# Patient Record
Sex: Male | Born: 1970 | Race: Black or African American | Hispanic: No | Marital: Single | Smoking: Never smoker
Health system: Southern US, Community
[De-identification: ages and names within clinical notes are randomized; demographics above are authoritative.]

## PROBLEM LIST (undated history)

## (undated) DIAGNOSIS — J45909 Unspecified asthma, uncomplicated: Secondary | ICD-10-CM

## (undated) DIAGNOSIS — J302 Other seasonal allergic rhinitis: Secondary | ICD-10-CM

---
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
## 2005-06-18 ENCOUNTER — Encounter: Admission: RE | Admit: 2005-06-18 | Discharge: 2005-09-16 | Payer: Self-pay | Admitting: Family Medicine

## 2008-03-12 ENCOUNTER — Ambulatory Visit (HOSPITAL_COMMUNITY): Admission: EM | Admit: 2008-03-12 | Discharge: 2008-03-12 | Payer: Self-pay | Admitting: Emergency Medicine

## 2008-03-12 HISTORY — PX: OTHER SURGICAL HISTORY: SHX169

## 2010-09-22 NOTE — Op Note (Signed)
NAME:  Christopher Reilly, Christopher Reilly NO.:  [id number]   MEDICAL RECORD NO.:  [id number]          PATIENT TYPE:  EMS   LOCATION:  ED                           FACILITY:  [facility]   PHYSICIAN:  John C. Madilyn Fireman, M.D.    DATE OF BIRTH:  [birth year]   DATE OF PROCEDURE:  03/12/2008  DATE OF DISCHARGE:                               OPERATIVE REPORT   PROCEDURE PERFORMED:  Esophagogastroduodenoscopy with removal of foreign  body.   INDICATIONS FOR PROCEDURE:  A sensation of esophageal obstruction after  swallowing a large pill tonight.  The patient has been unable to handle  his saliva after 2-3 hours observation the emergency room.   PROCEDURE:  The patient was placed in the left lateral decubitus  position and placed on the pulse monitor with continuous low-flow oxygen  delivered by nasal cannula.  He was sedated with 50 mcg IV fentanyl and  5 mg IV Versed.  The Olympus video endoscope was advanced under direct  vision into the oropharynx and esophagus.  In the distal esophagus there  was a semi solid white object consistent with a large pill.  This was  pushed gently with the scope and passed into the stomach.  It revealed a  lot of bloody mucosa at the area of impaction.  I passed the scope  toward it and there were significant resistance with the clearly define  ring with the scope traversing the ring with some resistance.  There was  small amount of liquid secretions as well as the pill that had fallen  into the stomach but no other abnormalities in the stomach.  The scope  was then withdrawn and the patient returned to the recovery room in  stable condition.  He tolerated the procedure well.  There were no  immediate complications.   IMPRESSION:  Esophageal foreign body (pill) relieved with the endoscope.   PLAN:  Will have the patient follow up for elective esophageal  dilatation as an outpatient.           ______________________________  Everardo All. Madilyn Fireman, M.D.     JCH/MEDQ   D:  03/12/2008  T:  03/12/2008  Job:  [id number]

## 2013-05-25 ENCOUNTER — Emergency Department (INDEPENDENT_AMBULATORY_CARE_PROVIDER_SITE_OTHER)
Admission: EM | Admit: 2013-05-25 | Discharge: 2013-05-25 | Disposition: A | Discharge status: Home / Self Care | Payer: Self-pay | Source: Home / Self Care | Attending: Family Medicine | Admitting: Family Medicine

## 2013-05-25 ENCOUNTER — Emergency Department (INDEPENDENT_AMBULATORY_CARE_PROVIDER_SITE_OTHER): Payer: Self-pay

## 2013-05-25 ENCOUNTER — Encounter (HOSPITAL_COMMUNITY): Payer: Self-pay | Admitting: Emergency Medicine

## 2013-05-25 DIAGNOSIS — R69 Illness, unspecified: Principal | ICD-10-CM

## 2013-05-25 DIAGNOSIS — J111 Influenza due to unidentified influenza virus with other respiratory manifestations: Secondary | ICD-10-CM

## 2013-05-25 HISTORY — DX: Other seasonal allergic rhinitis: J30.2

## 2013-05-25 HISTORY — DX: Unspecified asthma, uncomplicated: J45.909

## 2013-05-25 MED ORDER — PREDNISONE 10 MG PO TABS: 30.0000 mg | ORAL_TABLET | Freq: Every day | ORAL | Status: DC

## 2013-05-25 MED ORDER — IPRATROPIUM-ALBUTEROL 0.5-2.5 (3) MG/3ML IN SOLN
RESPIRATORY_TRACT | Status: AC
  Filled 2013-05-25: qty 3

## 2013-05-25 MED ORDER — HYDROCODONE-ACETAMINOPHEN 5-325 MG PO TABS: 0.5000 | ORAL_TABLET | Freq: Every evening | ORAL | Status: DC | PRN

## 2013-05-25 MED ORDER — IPRATROPIUM-ALBUTEROL 0.5-2.5 (3) MG/3ML IN SOLN
3.0000 mL | Freq: Once | RESPIRATORY_TRACT | Status: AC
  Administered 2013-05-25: 3 mL via RESPIRATORY_TRACT

## 2013-05-25 NOTE — ED Provider Notes (Signed)
Christopher Reilly is a 43 y.o. male who presents to Urgent Care today for 3 days of headache chills body aches cough productive of green sputum and nasal congestion and runny nose. Patient denies any shortness of breath nausea vomiting or diarrhea. He's tried some over-the-counter cough and cold medications which have not worked very well. He did not receive a flu vaccination yet this year.    patient has a pertinent past medical history for mild intermittent asthma.   Past Medical History  Diagnosis Date  . Seasonal allergies   . Asthma    History  Substance Use Topics  . Smoking status: Never Smoker   . Smokeless tobacco: Not on file  . Alcohol Use: No   ROS as above Medications: No current facility-administered medications for this encounter.   Current Outpatient Prescriptions  Medication Sig Dispense Refill  . HYDROcodone-acetaminophen (NORCO/VICODIN) 5-325 MG per tablet Take 0.5 tablets by mouth at bedtime as needed (cough).  10 tablet  0  . predniSONE (DELTASONE) 10 MG tablet Take 3 tablets (30 mg total) by mouth daily.  15 tablet  0    Exam:  BP 128/82  Pulse 88  Temp(Src) 98 F (36.7 C) (Oral)  Resp 16  SpO2 99% Gen: Well NAD HEENT: EOMI,  MMM Lungs: Normal work of breathing. Bilateral basilar crackles Heart: RRR no MRG Abd: NABS, Soft. NT, ND Exts: Brisk capillary refill, warm and well perfused.   Patient was given a DuoNeb nebulizer treatment and had no significant improvement in symptoms.  No results found for this or any previous visit (from the past 24 hour(s)). Dg Chest 2 View  05/25/2013   CLINICAL DATA:  Body aches, cough, low-grade fever.  EXAM: CHEST  2 VIEW  COMPARISON:  No priors.  FINDINGS: Lung volumes are normal. No consolidative airspace disease. No pleural effusions. No pneumothorax. No pulmonary nodule or mass noted. Pulmonary vasculature and the cardiomediastinal silhouette are within normal limits.  IMPRESSION: 1.  No radiographic evidence of acute  cardiopulmonary disease.   Electronically Signed   By: Trudie Reedaniel  Entrikin M.D.   On: 05/25/2013 16:32    Assessment and Plan: 43 y.o. male with  influenza-like illness with possible bronchitis. Plan to treat with prednisone and and hydrocodone-based cough and pain medication. Recommend followup with primary care provider as needed.  Discussed warning signs or symptoms. Please see discharge instructions. Patient expresses understanding.    Rodolph BongEvan S Prudy Candy, MD 05/25/13 1700

## 2013-05-25 NOTE — Discharge Instructions (Signed)
Thank you for coming in today. Use hydrocodone containing cough medication as needed. Take prednisone daily for 5 days. Use up to 2 Aleve twice daily for pain. Call or go to the emergency room if you get worse, have trouble breathing, have chest pains, or palpitations.

## 2013-05-25 NOTE — ED Notes (Signed)
C/o body aches onset Tues and fever Wed. early AM.  C/o feeling tired, no appetite, occ. cough, and some nasal congestion. No sore throat.

## 2015-01-16 IMAGING — CR DG CHEST 2V
2 series · 2 of 2 positions shown · non-contrast
Comparison: No priors.

CLINICAL DATA: Body aches, cough, low-grade fever.

EXAM:
CHEST  2 VIEW

[view not recorded (1 of 2)]
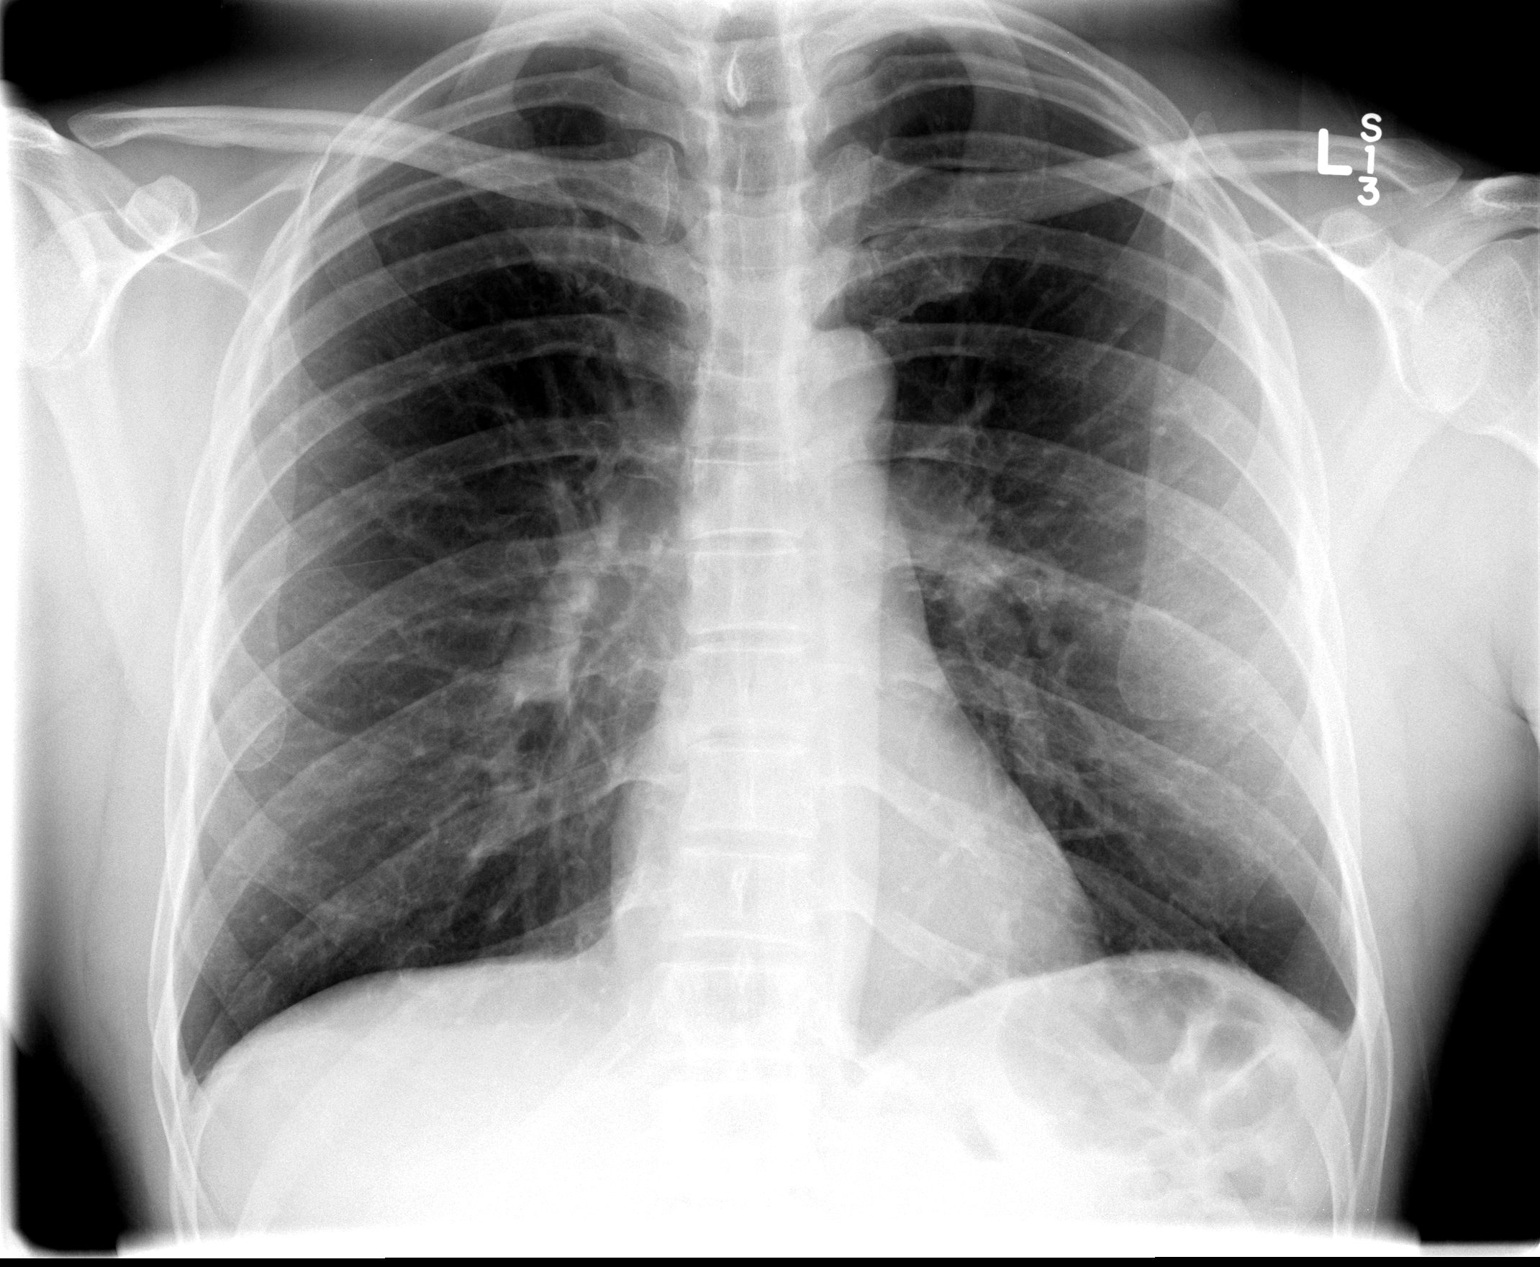

[view not recorded (2 of 2)]
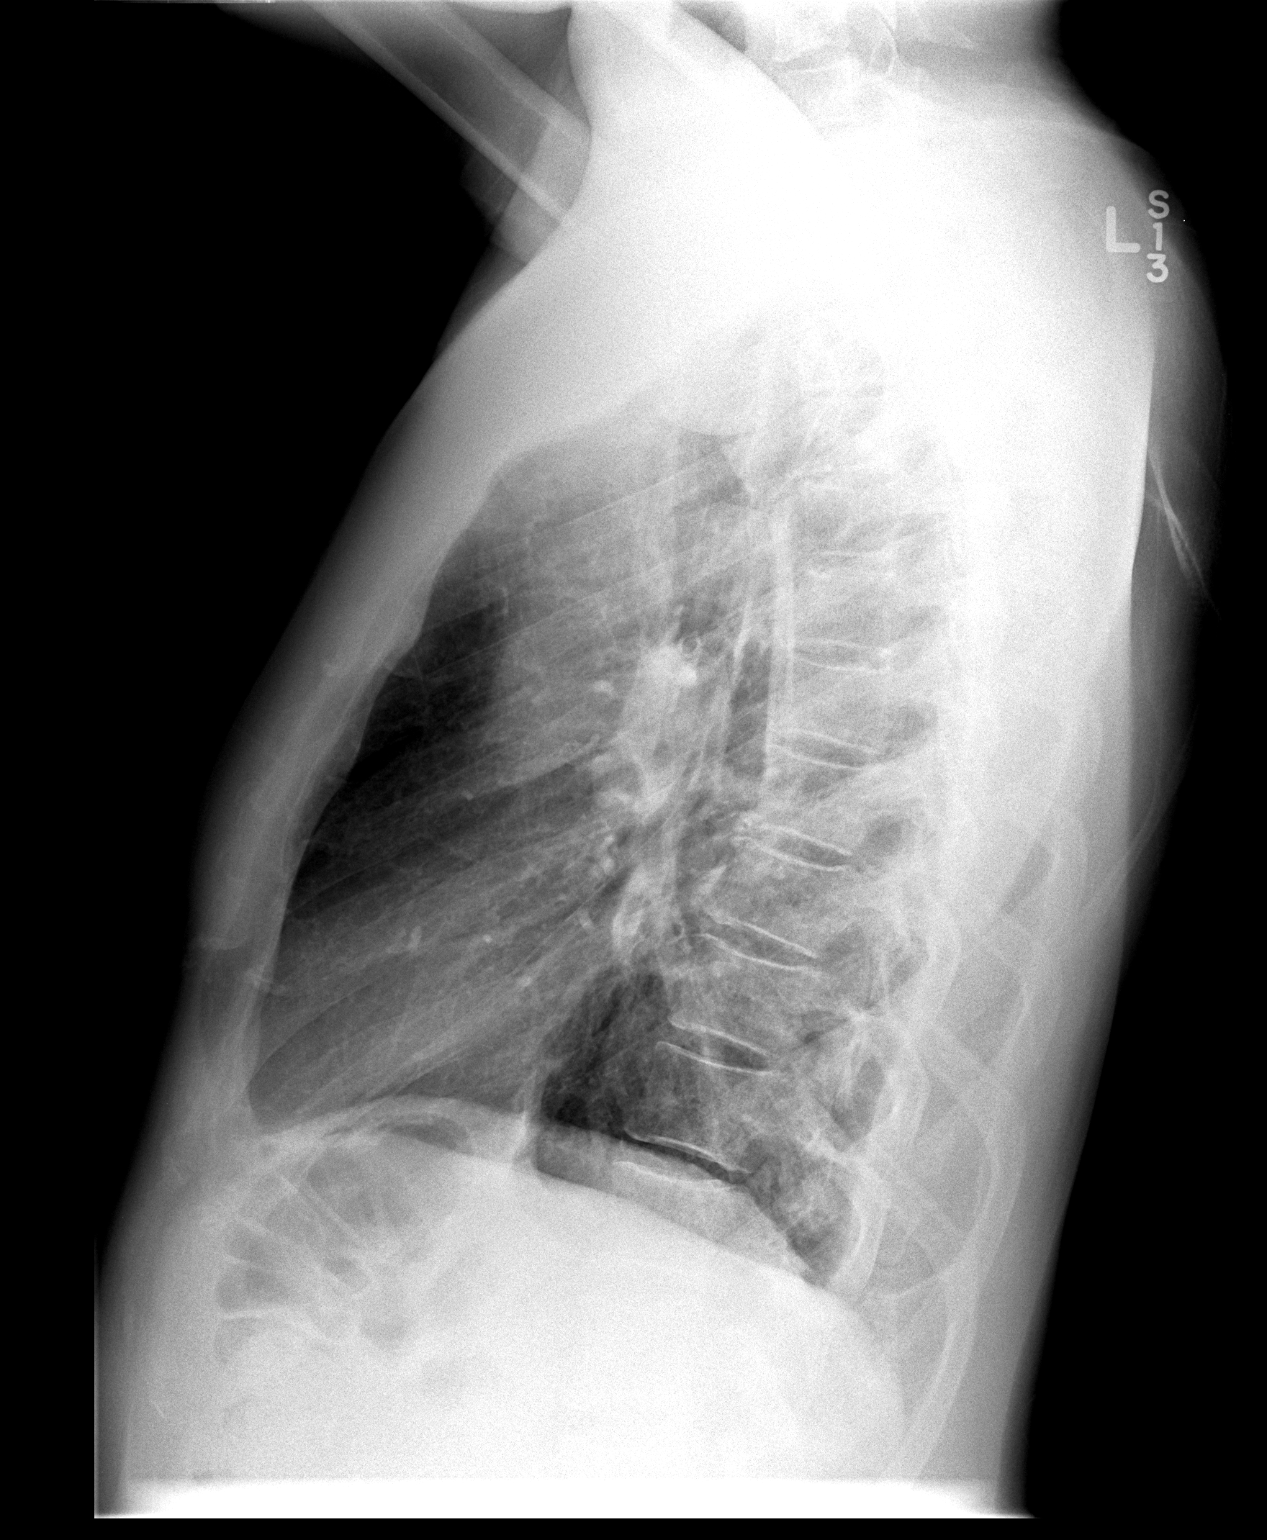

[2 of 2 positions shown; findings below may reference images not displayed]

FINDINGS: Lung volumes are normal. No consolidative airspace disease. No
pleural effusions. No pneumothorax. No pulmonary nodule or mass
noted. Pulmonary vasculature and the cardiomediastinal silhouette
are within normal limits.
IMPRESSION: 1.  No radiographic evidence of acute cardiopulmonary disease.

## 2018-07-11 ENCOUNTER — Encounter: Payer: Self-pay | Admitting: Family Medicine

## 2018-07-13 ENCOUNTER — Ambulatory Visit: Payer: Self-pay | Admitting: Family Medicine

## 2018-10-15 DIAGNOSIS — J45909 Unspecified asthma, uncomplicated: Secondary | ICD-10-CM | POA: Insufficient documentation

## 2018-10-15 DIAGNOSIS — R1033 Periumbilical pain: Secondary | ICD-10-CM | POA: Diagnosis present

## 2018-10-15 DIAGNOSIS — K5792 Diverticulitis of intestine, part unspecified, without perforation or abscess without bleeding: Secondary | ICD-10-CM | POA: Insufficient documentation

## 2018-10-16 ENCOUNTER — Emergency Department (HOSPITAL_COMMUNITY)
Admission: EM | Admit: 2018-10-16 | Discharge: 2018-10-16 | Disposition: A | Discharge status: Home / Self Care | Payer: BLUE CROSS/BLUE SHIELD | Attending: Emergency Medicine | Admitting: Emergency Medicine

## 2018-10-16 ENCOUNTER — Emergency Department (HOSPITAL_COMMUNITY): Payer: BLUE CROSS/BLUE SHIELD

## 2018-10-16 ENCOUNTER — Other Ambulatory Visit: Payer: Self-pay

## 2018-10-16 ENCOUNTER — Encounter (HOSPITAL_COMMUNITY): Payer: Self-pay | Admitting: Emergency Medicine

## 2018-10-16 DIAGNOSIS — K5792 Diverticulitis of intestine, part unspecified, without perforation or abscess without bleeding: Secondary | ICD-10-CM

## 2018-10-16 DIAGNOSIS — R1033 Periumbilical pain: Secondary | ICD-10-CM

## 2018-10-16 LAB — LIPASE, BLOOD: Lipase: 29 U/L (ref 11–51)

## 2018-10-16 LAB — URINALYSIS, ROUTINE W REFLEX MICROSCOPIC
Bilirubin Urine: NEGATIVE
Glucose, UA: NEGATIVE mg/dL
Hgb urine dipstick: NEGATIVE
Ketones, ur: NEGATIVE mg/dL
Leukocytes,Ua: NEGATIVE
Nitrite: NEGATIVE
Protein, ur: NEGATIVE mg/dL
Specific Gravity, Urine: 1.023 (ref 1.005–1.030)
pH: 5 (ref 5.0–8.0)

## 2018-10-16 LAB — COMPREHENSIVE METABOLIC PANEL
ALT: 23 U/L (ref 0–44)
AST: 24 U/L (ref 15–41)
Albumin: 4.1 g/dL (ref 3.5–5.0)
Alkaline Phosphatase: 64 U/L (ref 38–126)
Anion gap: 8 (ref 5–15)
BUN: 9 mg/dL (ref 6–20)
CO2: 26 mmol/L (ref 22–32)
Calcium: 9.7 mg/dL (ref 8.9–10.3)
Chloride: 104 mmol/L (ref 98–111)
Creatinine, Ser: 1.17 mg/dL (ref 0.61–1.24)
GFR calc Af Amer: >60 mL/min (ref >60)
GFR calc non Af Amer: >60 mL/min (ref >60)
Glucose, Bld: 156 mg/dL — ABNORMAL HIGH (ref 70–99)
Potassium: 4 mmol/L (ref 3.5–5.1)
Sodium: 138 mmol/L (ref 135–145)
Total Bilirubin: 0.5 mg/dL (ref 0.3–1.2)
Total Protein: 7.2 g/dL (ref 6.5–8.1)

## 2018-10-16 LAB — CBC
HCT: 43.6 % (ref 39.0–52.0)
Hemoglobin: 15.1 g/dL (ref 13.0–17.0)
MCH: 31.7 pg (ref 26.0–34.0)
MCHC: 34.6 g/dL (ref 30.0–36.0)
MCV: 91.6 fL (ref 80.0–100.0)
Platelets: 205 10*3/uL (ref 150–400)
RBC: 4.76 MIL/uL (ref 4.22–5.81)
RDW: 12 % (ref 11.5–15.5)
WBC: 9.1 10*3/uL (ref 4.0–10.5)
nRBC: 0 % (ref 0.0–0.2)

## 2018-10-16 MED ORDER — AMOXICILLIN-POT CLAVULANATE 875-125 MG PO TABS: 1.0000 | ORAL_TABLET | Freq: Two times a day (BID) | ORAL | 0 refills | Status: AC

## 2018-10-16 MED ORDER — SODIUM CHLORIDE 0.9% FLUSH
3.0000 mL | Freq: Once | INTRAVENOUS | Status: AC
  Administered 2018-10-16: 3 mL via INTRAVENOUS

## 2018-10-16 MED ORDER — IOHEXOL 300 MG/ML  SOLN
100.0000 mL | Freq: Once | INTRAMUSCULAR | Status: AC | PRN
  Administered 2018-10-16: 100 mL via INTRAVENOUS

## 2018-10-16 NOTE — Discharge Instructions (Addendum)
You have been seen today for abdominal pain. Please read and follow all provided instructions.   1. Medications: Augmentin (antibiotic), usual home medications 2. Treatment: rest, drink plenty of fluids 3. Follow Up: Please follow up with your primary doctor in 2 days for discussion of your diagnoses and further evaluation after today's visit; if you do not have a primary care doctor use the resource guide provided to find one; Please return to the ER for any new or worsening symptoms. Please obtain all of your results from medical records or have your doctors office obtain the results - share them with your doctor - you should be seen at your doctors office. Call today to arrange your follow up.   Take medications as prescribed. Please review all of the medicines and only take them if you do not have an allergy to them. Return to the emergency room for worsening condition or new concerning symptoms. Follow up with your regular doctor. If you don't have a regular doctor use one of the numbers below to establish a primary care doctor.  Please be aware that if you are taking birth control pills, taking other prescriptions, ESPECIALLY ANTIBIOTICS may make the birth control ineffective - if this is the case, either do not engage in sexual activity or use alternative methods of birth control such as condoms until you have finished the medicine and your family doctor says it is OK to restart them. If you are on a blood thinner such as COUMADIN, be aware that any other medicine that you take may cause the coumadin to either work too much, or not enough - you should have your coumadin level rechecked in next 7 days if this is the case.  ?  It is also a possibility that you have an allergic reaction to any of the medicines that you have been prescribed - Everybody reacts differently to medications and while MOST people have no trouble with most medicines, you may have a reaction such as nausea, vomiting, rash,  swelling, shortness of breath. If this is the case, please stop taking the medicine immediately and contact your physician.  ?  You should return to the ER if you develop severe or worsening symptoms.   Emergency Department Resource Guide 1) Find a Doctor and Pay Out of Pocket Although you won't have to find out who is covered by your insurance plan, it is a good idea to ask around and get recommendations. You will then need to call the office and see if the doctor you have chosen will accept you as a new patient and what types of options they offer for patients who are self-pay. Some doctors offer discounts or will set up payment plans for their patients who do not have insurance, but you will need to ask so you aren't surprised when you get to your appointment.  2) Contact Your Local Health Department Not all health departments have doctors that can see patients for sick visits, but many do, so it is worth a call to see if yours does. If you don't know where your local health department is, you can check in your phone book. The CDC also has a tool to help you locate your state's health department, and many state websites also have listings of all of their local health departments.  3) Find a [facility] If your illness is not likely to be very severe or complicated, you may want to try a walk in clinic. These are popping up  all over the country in pharmacies, drugstores, and shopping centers. They're usually staffed by nurse practitioners or physician assistants that have been trained to treat common illnesses and complaints. They're usually fairly quick and inexpensive. However, if you have serious medical issues or chronic medical problems, these are probably not your best option.  No Primary Care Doctor: Call Health Connect at  [phone] - they can help you locate a primary care doctor that  accepts your insurance, provides certain services, etc. Physician Referral Service-  [phone]  Chronic Pain Problems: Organization         Address  Phone   Notes  [facility]  [phone] Patients need to be referred by their primary care doctor.   Medication Assistance: Organization         Address  Phone   Notes  [facility] [address] [place], [place] [address] [phone] --Must be a resident of [facility] -- Must have NO insurance coverage whatsoever (no Medicaid/ Medicare, etc.) -- The pt. MUST have a primary care doctor that directs their care regularly and follows them in the community   [facility]  [phone]   [employer]  [phone]    Agencies that provide inexpensive medical care: Organization         Address  Phone   Notes  [place]  [phone]   [facility]    [phone]   [facility] [place], [place] [address] [phone]   [place] [address] [place] [phone]   [facility]    [phone]   [facility]    [phone]   [place] [address] [place] [address], [place] Phone:  [phone], Fax:  [phone] Hours of Operation:  9 am - 6 pm, M-F.  Also accepts Medicaid/Medicare and self-pay.  [facility] for [place] [address], [address], [place] Phone: [phone], Fax: [phone]. Hours of Operation:  8:30 am - 5:30 pm, M-F.  Also accepts Medicaid and self-pay.  [facility] [address] [place] Phone: [phone]   [place], [place], [place] [phone], Ext. [phone] Mondays & Thursdays: 7-9 AM.  First 15 patients are seen on a first come, first serve basis.    [place] Providers:  Organization         Address  Phone   Notes  [facility] [address],  [place] [phone] Also accepts self-pay patients.  [facility] [address], [place]  [phone]   [place], [address], [place] [phone]   [facility] [address] [place] [phone]   Lucianne Lei [address], [place]   [phone] Only accepts [place] [facility] [place] patients after they have their name applied to their card.   Self-Pay (no insurance) in [facility]:  Organization         Address  Phone   Notes  Sickle Cell Patients, [facility] [place] [phone]   [facility] [place] [phone]   [facility] [place]  [address], [place] [phone]   [facility]/Dr. Osei-Bonsu  [address], [place] or [address], [place] [phone] Phone number for both [place] and [place] locations is the same.  [facility] [address] [place] [phone]   [facility] [address], [place] or [address] [phone]   [facility] [address] [place] [phone], phone; [phone], fax Sees patients 1st and 3rd Saturday of every month.  Must not qualify for public or private insurance (i.e. Medicaid, Medicare, [place] Health Choice, Veterans' Benefits)  Household income should be no more than 200% of the poverty level The clinic cannot treat you if you are pregnant or think you are pregnant  Sexually transmitted diseases are not treated at the clinic.

## 2018-10-16 NOTE — ED Triage Notes (Signed)
Pt c/o dull but severe mid abd pain x 4 hours, +n/v earlier today. Last BM normal per pt.

## 2018-10-16 NOTE — ED Provider Notes (Signed)
[facility] EMERGENCY DEPARTMENT Provider Note   CSN: [id number] Arrival date & time: 10/15/18  2350  History   Chief Complaint Chief Complaint  Patient presents with   Abdominal Pain    HPI Christopher Reilly is a 48 y.o. male with a PMH of Asthma and seasonal allergies presenting with constant periumbilical abdominal pain radiating to RLQ onset at 7pm last night. Patient describes pain as a dull severe pain. Patient states he was doing homework when symptoms began. Patient reports nausea and one episode of non bilious, non bloody vomiting. Patient states pain has improved on its own while in the ER. Patient denies diarrhea or constipation. Patient states last BM was today and it was normal. Patient denies taking any medications for symptoms. Patient states nothing makes symptoms better or worse. Patient denies dysuria, urinary frequency, or hematuria. Patient reports a hernia repair years ago without any complications. Patient denies alcohol, tobacco, or drug use. Patient denies fever, cough, congestion, chest pain, shortness of breath, sick contacts, or recent travel.     HPI  Past Medical History:  Diagnosis Date   Asthma    Seasonal allergies     There are no active problems to display for this patient.   Past Surgical History:  Procedure Laterality Date   Esophagogastroduodenoscopy with removal of foreign body   03/12/2008   HERNIA REPAIR  1988   abdominal        Home Medications    Prior to Admission medications   Medication Sig Start Date End Date Taking? Authorizing Provider  amoxicillin-clavulanate (AUGMENTIN) 875-125 MG tablet Take 1 tablet by mouth every 12 (twelve) hours for 7 days. 10/16/18 10/23/18  Arville Lime, PA-C    Family History Family History  Problem Relation Age of Onset   Hypertension Father    High Cholesterol Father     Social History Social History   Tobacco Use   Smoking status: Never Smoker   Smokeless  tobacco: Never Used  Substance Use Topics   Alcohol use: No   Drug use: No     Allergies   Patient has no known allergies.   Review of Systems Review of Systems  Constitutional: Negative for activity change, appetite change, chills, fever and unexpected weight change.  HENT: Negative for congestion, rhinorrhea and sore throat.   Eyes: Negative for visual disturbance.  Respiratory: Negative for cough and shortness of breath.   Cardiovascular: Negative for chest pain.  Gastrointestinal: Positive for abdominal pain, nausea and vomiting. Negative for constipation and diarrhea.  Endocrine: Negative for polydipsia, polyphagia and polyuria.  Genitourinary: Negative for dysuria, flank pain and frequency.  Musculoskeletal: Negative for back pain.  Skin: Negative for rash.  Allergic/Immunologic: Negative for immunocompromised state.  Psychiatric/Behavioral: The patient is not nervous/anxious.      Physical Exam Updated Vital Signs BP (!) 138/100 (BP Location: Right Arm)    Pulse 73    Temp (!) 97.5 F (36.4 C) (Oral)    Resp 16    Ht 5\' 11"  (1.803 m)    Wt 79.4 kg    SpO2 98%    BMI 24.41 kg/m   Physical Exam Vitals signs and nursing note reviewed.  Constitutional:      General: He is not in acute distress.    Appearance: He is well-developed. He is not diaphoretic.  HENT:     Head: Normocephalic and atraumatic.  Neck:     Musculoskeletal: Normal range of motion and neck supple.  Cardiovascular:  Rate and Rhythm: Normal rate and regular rhythm.     Heart sounds: Normal heart sounds. No murmur. No friction rub. No gallop.   Pulmonary:     Effort: Pulmonary effort is normal. No respiratory distress.     Breath sounds: Normal breath sounds. No wheezing or rales.  Abdominal:     General: Bowel sounds are normal. There is no distension.     Palpations: Abdomen is soft. Abdomen is not rigid. There is no mass.     Tenderness: There is abdominal tenderness in the right lower  quadrant and periumbilical area. There is no right CVA tenderness, left CVA tenderness, guarding or rebound. Positive signs include McBurney's sign. Negative signs include Rovsing's sign, psoas sign and obturator sign.     Hernia: No hernia is present.  Musculoskeletal: Normal range of motion.  Skin:    Findings: No rash.  Neurological:     Mental Status: He is alert and oriented to person, place, and time.    ED Treatments / Results  Labs (all labs ordered are listed, but only abnormal results are displayed) Labs Reviewed  COMPREHENSIVE METABOLIC PANEL - Abnormal; Notable for the following components:      Result Value   Glucose, Bld 156 (*)    All other components within normal limits  LIPASE, BLOOD  CBC  URINALYSIS, ROUTINE W REFLEX MICROSCOPIC    EKG None  Radiology Ct Abdomen Pelvis W Contrast  Result Date: 10/16/2018 CLINICAL DATA:  Nausea and vomiting. Abdominal pain. Appendicitis suspected. EXAM: CT ABDOMEN AND PELVIS WITH CONTRAST TECHNIQUE: Multidetector CT imaging of the abdomen and pelvis was performed using the standard protocol following bolus administration of intravenous contrast. CONTRAST:  100mL OMNIPAQUE IOHEXOL 300 MG/ML  SOLN COMPARISON:  None. FINDINGS: Lower chest: The heart size is normal. Lungs are clear. There is no edema or effusion. Hepatobiliary: No focal liver abnormality is seen. No gallstones, gallbladder wall thickening, or biliary dilatation. Pancreas: Unremarkable. No pancreatic ductal dilatation or surrounding inflammatory changes. Spleen: Normal in size without focal abnormality. Adrenals/Urinary Tract: Adrenal glands are normal bilaterally. Kidneys and ureters are within normal limits. There is no stone or mass lesion. The urinary bladder is normal. Stomach/Bowel: The stomach and duodenum are within normal limits. Small bowel is unremarkable. Terminal ileum is within normal limits. There is stranding about the cecum. The appendix is visualized,  gas-filled, and normal. Stranding is more superior along the lateral margin of the cecum. No discrete mass lesion is present. Hepatic flexure is within normal limits. The transverse colon and splenic flexure are normal. The descending and sigmoid colon are normal. Vascular/Lymphatic: No significant vascular findings are present. No enlarged abdominal or pelvic lymph nodes. Reproductive: Prostate gland is enlarged, measuring 5.1 cm in transverse diameter. Other: No abdominal wall hernia or abnormality. No abdominopelvic ascites. Musculoskeletal: 5 non rib-bearing lumbar type vertebral bodies are present. Vertebral body heights and alignment are maintained. Bony pelvis is within normal limits. Hips are located and normal. IMPRESSION: 1. Inflammatory changes along the lateral margin of the cecum and proximal ascending colon suggesting a focal colitis. This may represent diverticulitis. No discrete mass lesion is present. 2. The appendix is visualized and normal. 3. Mild distention of the stomach without an obstructing mass. Electronically Signed   By: Marin Robertshristopher  Mattern M.D.   On: 10/16/2018 06:23    Procedures Procedures (including critical care time)  Medications Ordered in ED Medications  sodium chloride flush (NS) 0.9 % injection 3 mL (3 mLs Intravenous Given 10/16/18  [id number])  iohexol (OMNIPAQUE) 300 MG/ML solution 100 mL (100 mLs Intravenous Contrast Given 10/16/18 0549)     Initial Impression / Assessment and Plan / ED Course  I have reviewed the triage vital signs and the nursing notes.  Pertinent labs & imaging results that were available during my care of the patient were reviewed by me and considered in my medical decision making (see chart for details).  Clinical Course as of Oct 15 640  Mon Oct 16, 2018  0451 CBC is unremarkable.   CBC [AH]  0451 UA is negative.  Urinalysis, Routine w reflex microscopic [AH]  0451 CMP is within normal limits except hyperglycemia noted at 156.    Comprehensive metabolic panel(!) [AH]  [id number] Lipase is within normal limits.  Lipase, blood [AH]  0628 CT abdomen reveals inflammatory changes along the lateral margin of the cecum and proximal ascending colon suggesting a focal colitis. This may represent diverticulitis. No discrete mass lesion is present. The appendix is visualized and normal. Mild distention of the stomach without an obstructing mass.    CT Abdomen Pelvis W Contrast [AH]    Clinical Course User Index [AH] Leretha DykesHernandez, Elya Diloreto P, PA-C       Patient presents with periumbilical abdominal pain radiating to RLQ. Patient is nontoxic, nonseptic appearing, in no apparent distress.  Patient's pain and other symptoms adequately managed in emergency department.  Pain improved prior to coming to the ER. Oral fluids encouraged. Labs, imaging and vitals reviewed.  Patient does not meet the SIRS or Sepsis criteria.  CT abdomen reveals possible diverticulitis. Patient does not meet criteria for inpatient treatment. On repeat exam patient does not have a surgical abdomin and there are no peritoneal signs.  No indication of appendicitis, bowel obstruction, bowel perforation, or cholecystitis. Patient discharged home with antibiotics and given strict instructions for follow-up with their primary care physician.  I have also discussed reasons to return immediately to the ER.  Patient expresses understanding and agrees with plan.  Findings and plan of care discussed with supervising physician Dr. Nicanor AlconPalumbo.  Final Clinical Impressions(s) / ED Diagnoses   Final diagnoses:  Periumbilical abdominal pain  Diverticulitis    ED Discharge Orders         Ordered    amoxicillin-clavulanate (AUGMENTIN) 875-125 MG tablet  Every 12 hours     10/16/18 0639           Leretha DykesHernandez, Keryn Nessler P, PA-C 10/16/18 0645    [place], April, MD 10/16/18 2317

## 2018-10-16 NOTE — ED Notes (Signed)
Transported to CT 

## 2020-06-08 IMAGING — CT CT ABDOMEN AND PELVIS WITH CONTRAST
2 of 5 series · 15 of 46 positions shown, 17 images · IV contrast (APPLIED)
Comparison: None.

CLINICAL DATA: Nausea and vomiting. Abdominal pain. Appendicitis
suspected.

EXAM:
CT ABDOMEN AND PELVIS WITH CONTRAST
TECHNIQUE: Multidetector CT imaging of the abdomen and pelvis was performed
using the standard protocol following bolus administration of
intravenous contrast.
CONTRAST:  100mL OMNIPAQUE IOHEXOL 300 MG/ML  SOLN

[Series 3: abdomen 5.0 · axial · 0.75mm/px · z∈[+908,+1273]mm · 12 of 87 slices shown, 14 images]
[im 7/87  soft-tissue]
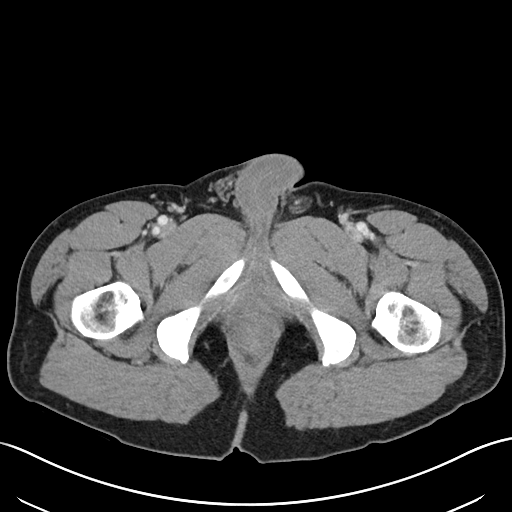
[im 7/87  bone]
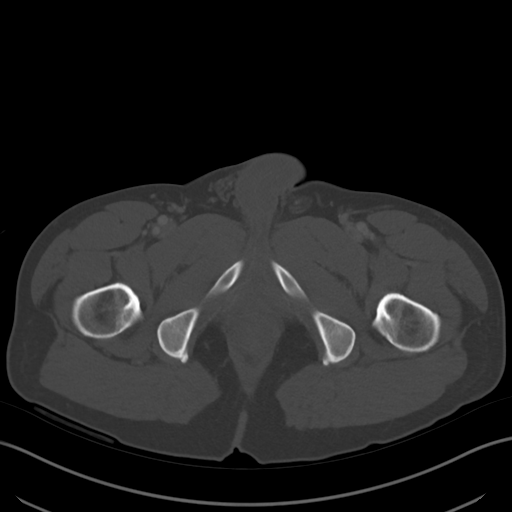
[im 14/87  soft-tissue]
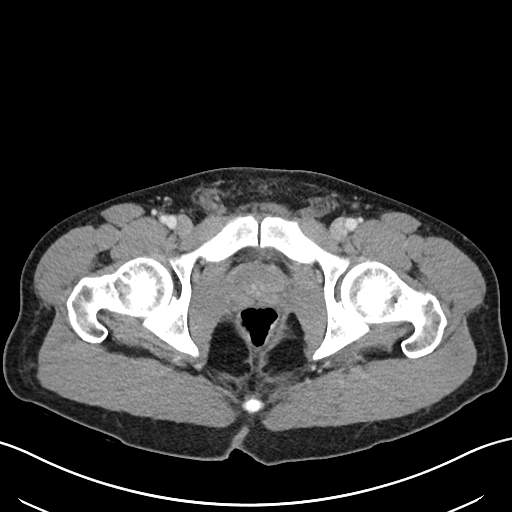
[im 20/87  soft-tissue]
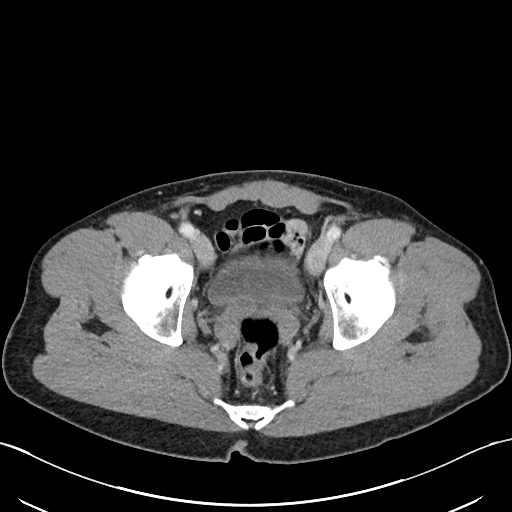
[im 27/87  soft-tissue]
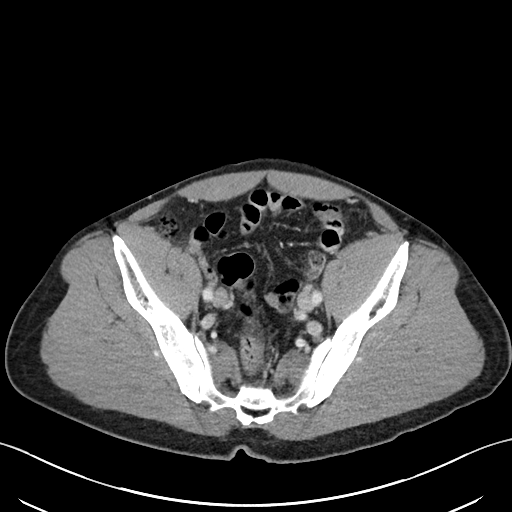
[im 34/87  soft-tissue]
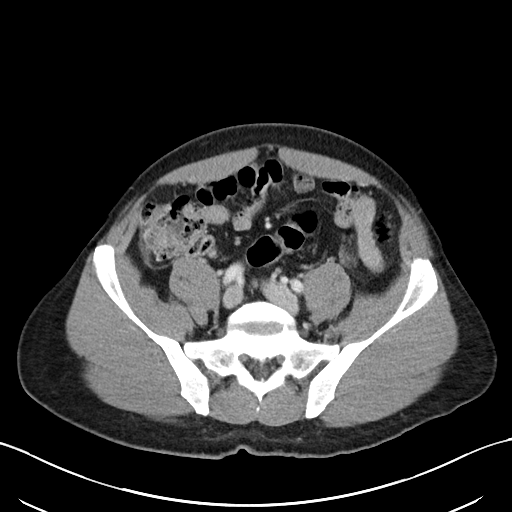
[im 40/87  soft-tissue]
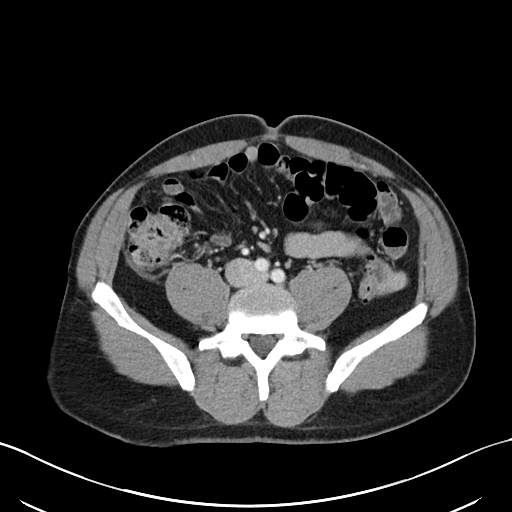
[im 47/87  soft-tissue]
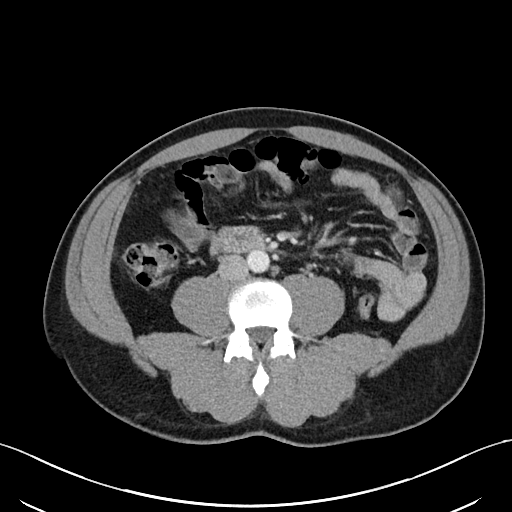
[im 53/87  soft-tissue]
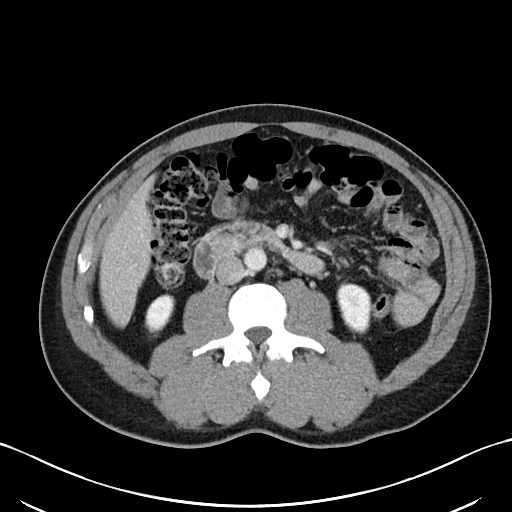
[im 60/87  soft-tissue]
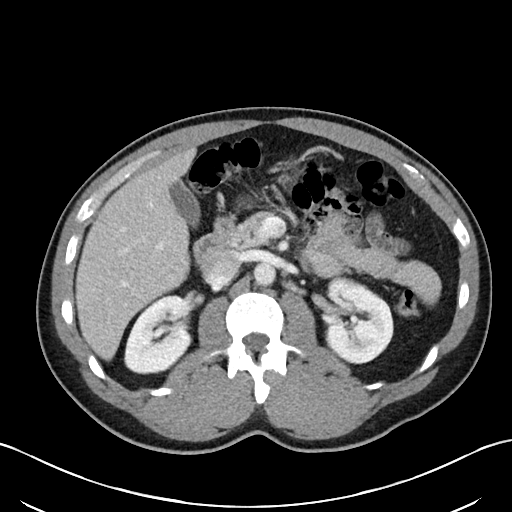
[im 60/87  bone]
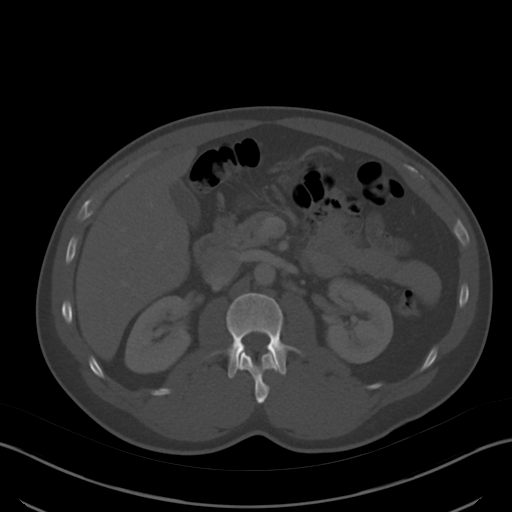
[im 67/87  soft-tissue]
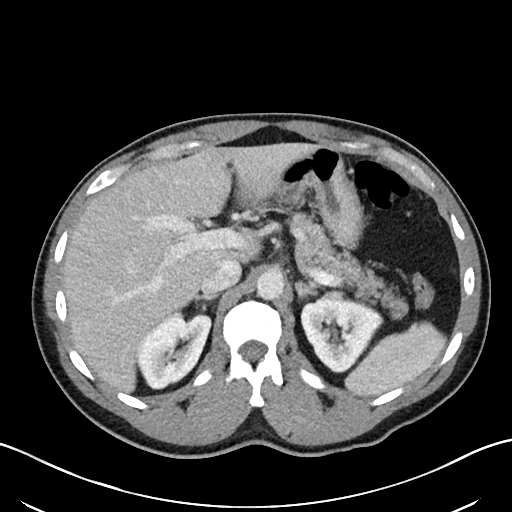
[im 73/87  soft-tissue]
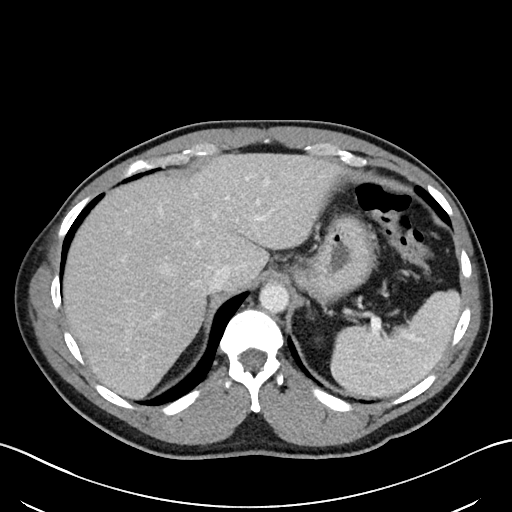
[im 80/87  soft-tissue]
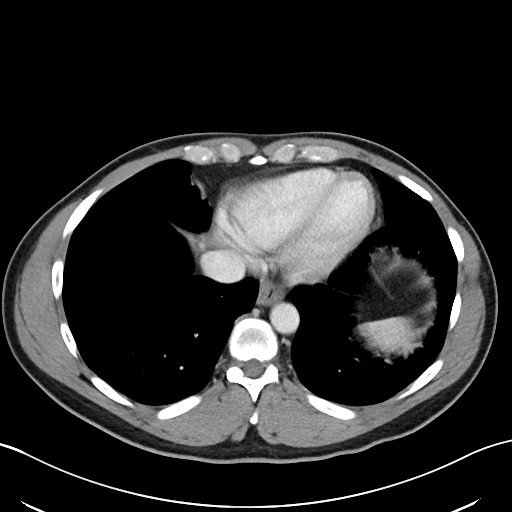

[Series 6: abdomen 3.0 mpr cor · coronal · 0.69mm/px · 3 of 89 slices shown]
[im 30/89  soft-tissue]
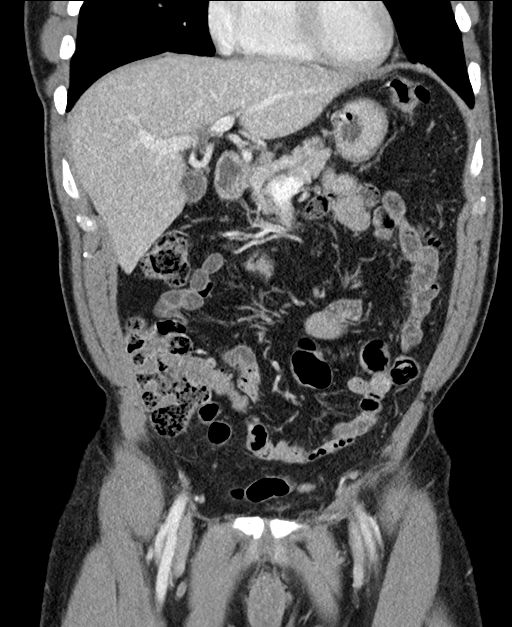
[im 40/89  soft-tissue]
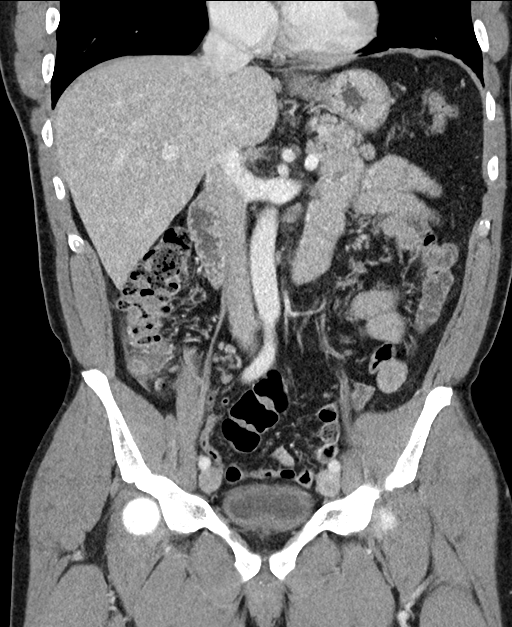
[im 49/89  soft-tissue]
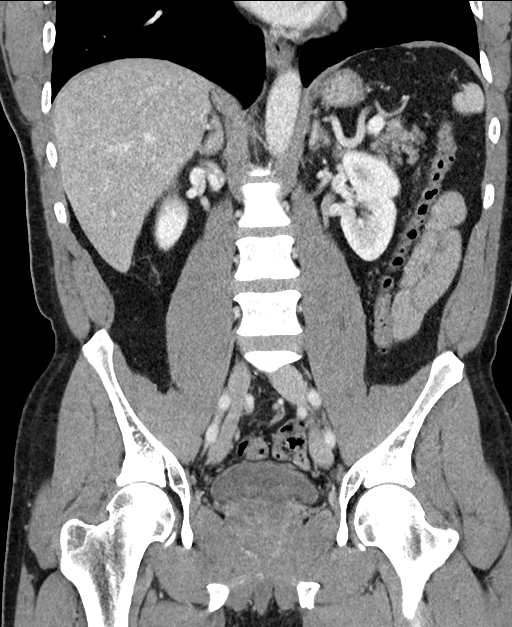

[15 of 46 positions shown; findings below may reference images not displayed]

FINDINGS: Lower chest: The heart size is normal. Lungs are clear. There is no
edema or effusion.

Hepatobiliary: No focal liver abnormality is seen. No gallstones,
gallbladder wall thickening, or biliary dilatation.

Pancreas: Unremarkable. No pancreatic ductal dilatation or
surrounding inflammatory changes.

Spleen: Normal in size without focal abnormality.

Adrenals/Urinary Tract: Adrenal glands are normal bilaterally.
Kidneys and ureters are within normal limits. There is no stone or
mass lesion. The urinary bladder is normal.

Stomach/Bowel: The stomach and duodenum are within normal limits.
Small bowel is unremarkable. Terminal ileum is within normal limits.
There is stranding about the cecum. The appendix is visualized,
gas-filled, and normal. Stranding is more superior along the lateral
margin of the cecum. No discrete mass lesion is present. Hepatic
flexure is within normal limits. The transverse colon and splenic
flexure are normal. The descending and sigmoid colon are normal.

Vascular/Lymphatic: No significant vascular findings are present. No
enlarged abdominal or pelvic lymph nodes.

Reproductive: Prostate gland is enlarged, measuring 5.1 cm in
transverse diameter.

Other: No abdominal wall hernia or abnormality. No abdominopelvic
ascites.

Musculoskeletal: 5 non rib-bearing lumbar type vertebral bodies are
present. Vertebral body heights and alignment are maintained. Bony
pelvis is within normal limits. Hips are located and normal.
IMPRESSION: 1. Inflammatory changes along the lateral margin of the cecum and
proximal ascending colon suggesting a focal colitis. This may
represent diverticulitis. No discrete mass lesion is present.
2. The appendix is visualized and normal.
3. Mild distention of the stomach without an obstructing mass.
